# Patient Record
Sex: Female | Born: 1965 | Race: White | Hispanic: No | Marital: Married | Smoking: Never smoker
Health system: Southern US, Community
[De-identification: ages and names within clinical notes are randomized; demographics above are authoritative.]

## PROBLEM LIST (undated history)

## (undated) DIAGNOSIS — F32A Depression, unspecified: Secondary | ICD-10-CM

## (undated) DIAGNOSIS — F329 Major depressive disorder, single episode, unspecified: Secondary | ICD-10-CM

## (undated) DIAGNOSIS — K56609 Unspecified intestinal obstruction, unspecified as to partial versus complete obstruction: Secondary | ICD-10-CM

## (undated) HISTORY — PX: ABDOMINAL HYSTERECTOMY: SHX81

## (undated) HISTORY — PX: BREAST SURGERY: SHX581

---

## 2015-11-22 ENCOUNTER — Emergency Department (HOSPITAL_BASED_OUTPATIENT_CLINIC_OR_DEPARTMENT_OTHER): Payer: BLUE CROSS/BLUE SHIELD

## 2015-11-22 ENCOUNTER — Encounter (HOSPITAL_BASED_OUTPATIENT_CLINIC_OR_DEPARTMENT_OTHER): Payer: Self-pay | Admitting: *Deleted

## 2015-11-22 ENCOUNTER — Inpatient Hospital Stay (HOSPITAL_BASED_OUTPATIENT_CLINIC_OR_DEPARTMENT_OTHER)
Admission: EM | Admit: 2015-11-22 | Discharge: 2015-11-24 | DRG: 390 | Disposition: A | Discharge status: Home / Self Care | Payer: BLUE CROSS/BLUE SHIELD | Attending: Internal Medicine | Admitting: Internal Medicine

## 2015-11-22 DIAGNOSIS — Z90710 Acquired absence of both cervix and uterus: Secondary | ICD-10-CM | POA: Diagnosis not present

## 2015-11-22 DIAGNOSIS — F39 Unspecified mood [affective] disorder: Secondary | ICD-10-CM

## 2015-11-22 DIAGNOSIS — Z23 Encounter for immunization: Secondary | ICD-10-CM | POA: Diagnosis not present

## 2015-11-22 DIAGNOSIS — Z79899 Other long term (current) drug therapy: Secondary | ICD-10-CM

## 2015-11-22 DIAGNOSIS — Z880 Allergy status to penicillin: Secondary | ICD-10-CM

## 2015-11-22 DIAGNOSIS — Z885 Allergy status to narcotic agent status: Secondary | ICD-10-CM

## 2015-11-22 DIAGNOSIS — K56600 Partial intestinal obstruction, unspecified as to cause: Secondary | ICD-10-CM | POA: Diagnosis present

## 2015-11-22 DIAGNOSIS — K56609 Unspecified intestinal obstruction, unspecified as to partial versus complete obstruction: Secondary | ICD-10-CM | POA: Diagnosis present

## 2015-11-22 HISTORY — DX: Unspecified intestinal obstruction, unspecified as to partial versus complete obstruction: K56.609

## 2015-11-22 HISTORY — DX: Depression, unspecified: F32.A

## 2015-11-22 HISTORY — DX: Major depressive disorder, single episode, unspecified: F32.9

## 2015-11-22 LAB — LIPASE, BLOOD: Lipase: 33 U/L (ref 11–51)

## 2015-11-22 LAB — COMPREHENSIVE METABOLIC PANEL
ALT: 23 U/L (ref 14–54)
AST: 26 U/L (ref 15–41)
Albumin: 4.2 g/dL (ref 3.5–5.0)
Alkaline Phosphatase: 72 U/L (ref 38–126)
Anion gap: 9 (ref 5–15)
BILIRUBIN TOTAL: 0.7 mg/dL (ref 0.3–1.2)
BUN: 23 mg/dL — AB (ref 6–20)
CHLORIDE: 102 mmol/L (ref 101–111)
CO2: 28 mmol/L (ref 22–32)
CREATININE: 0.9 mg/dL (ref 0.44–1.00)
Calcium: 9.6 mg/dL (ref 8.9–10.3)
GFR calc Af Amer: >60 mL/min (ref >60)
GLUCOSE: 120 mg/dL — AB (ref 65–99)
Potassium: 3.7 mmol/L (ref 3.5–5.1)
Sodium: 139 mmol/L (ref 135–145)
TOTAL PROTEIN: 7.4 g/dL (ref 6.5–8.1)

## 2015-11-22 LAB — URINALYSIS, ROUTINE W REFLEX MICROSCOPIC
Bilirubin Urine: NEGATIVE
GLUCOSE, UA: NEGATIVE mg/dL
HGB URINE DIPSTICK: NEGATIVE
Ketones, ur: NEGATIVE mg/dL
Leukocytes, UA: NEGATIVE
Nitrite: NEGATIVE
Protein, ur: NEGATIVE mg/dL
SPECIFIC GRAVITY, URINE: 1.005 (ref 1.005–1.030)
pH: 6.5 (ref 5.0–8.0)

## 2015-11-22 LAB — CBC
HCT: 43.1 % (ref 36.0–46.0)
Hemoglobin: 14 g/dL (ref 12.0–15.0)
MCH: 27.1 pg (ref 26.0–34.0)
MCHC: 32.5 g/dL (ref 30.0–36.0)
MCV: 83.5 fL (ref 78.0–100.0)
PLATELETS: 239 10*3/uL (ref 150–400)
RBC: 5.16 MIL/uL — ABNORMAL HIGH (ref 3.87–5.11)
RDW: 16.1 % — AB (ref 11.5–15.5)
WBC: 9.5 10*3/uL (ref 4.0–10.5)

## 2015-11-22 MED ORDER — ONDANSETRON HCL 4 MG/2ML IJ SOLN: 4.0000 mg | Freq: Four times a day (QID) | INTRAMUSCULAR | Status: DC | PRN

## 2015-11-22 MED ORDER — ONDANSETRON HCL 4 MG PO TABS: 4.0000 mg | ORAL_TABLET | Freq: Four times a day (QID) | ORAL | Status: DC | PRN

## 2015-11-22 MED ORDER — MORPHINE SULFATE (PF) 2 MG/ML IV SOLN
1.0000 mg | INTRAVENOUS | Status: DC | PRN
Start: 2015-11-22 — End: 2015-11-24
  Filled 2015-11-22: qty 1

## 2015-11-22 MED ORDER — ACETAMINOPHEN 325 MG PO TABS: 650.0000 mg | ORAL_TABLET | Freq: Four times a day (QID) | ORAL | Status: DC | PRN

## 2015-11-22 MED ORDER — ENOXAPARIN SODIUM 40 MG/0.4ML ~~LOC~~ SOLN
40.0000 mg | SUBCUTANEOUS | Status: DC
  Filled 2015-11-22: qty 0.4

## 2015-11-22 MED ORDER — LORAZEPAM 2 MG/ML IJ SOLN: 0.5000 mg | Freq: Two times a day (BID) | INTRAMUSCULAR | Status: DC | PRN

## 2015-11-22 MED ORDER — INFLUENZA VAC SPLIT QUAD 0.5 ML IM SUSY
0.5000 mL | PREFILLED_SYRINGE | INTRAMUSCULAR | Status: AC
  Administered 2015-11-24: 0.5 mL via INTRAMUSCULAR
  Filled 2015-11-22 (×2): qty 0.5

## 2015-11-22 MED ORDER — KETOROLAC TROMETHAMINE 30 MG/ML IJ SOLN
30.0000 mg | Freq: Once | INTRAMUSCULAR | Status: AC
  Administered 2015-11-23: 30 mg via INTRAVENOUS
  Filled 2015-11-22: qty 1

## 2015-11-22 MED ORDER — ONDANSETRON HCL 4 MG/2ML IJ SOLN
4.0000 mg | Freq: Once | INTRAMUSCULAR | Status: AC
  Administered 2015-11-22: 4 mg via INTRAVENOUS
  Filled 2015-11-22: qty 2

## 2015-11-22 MED ORDER — IOPAMIDOL (ISOVUE-300) INJECTION 61%
100.0000 mL | Freq: Once | INTRAVENOUS | Status: AC | PRN
  Administered 2015-11-22: 100 mL via INTRAVENOUS

## 2015-11-22 MED ORDER — DEXTROSE-NACL 5-0.9 % IV SOLN
INTRAVENOUS | Status: DC
  Administered 2015-11-22: 19:00:00 via INTRAVENOUS

## 2015-11-22 MED ORDER — SODIUM CHLORIDE 0.9 % IV BOLUS (SEPSIS)
1000.0000 mL | Freq: Once | INTRAVENOUS | Status: AC
  Administered 2015-11-22: 1000 mL via INTRAVENOUS

## 2015-11-22 MED ORDER — ACETAMINOPHEN 650 MG RE SUPP: 650.0000 mg | Freq: Four times a day (QID) | RECTAL | Status: DC | PRN

## 2015-11-22 NOTE — Progress Notes (Signed)
Called by Brand MalesWill Dancy PA.   50 y/o female h/o hysterectomy 6/17 with SBO afterwards resolving spontaneously. Has had Nausea, Vomiting, abdominal pain since yesterday. Found to have SBO on CT. Dr Daphine DeutscherMartin recommending admit to medicine. She has received Zofran and 1 L NS. Have recommended to order D5NS at 100cc/hr. Accepted to Med/surg bed.   Calvert CantorSaima Bari Leib, MD

## 2015-11-22 NOTE — ED Notes (Signed)
[facility] aware of bed [facility] for [facility]

## 2015-11-22 NOTE — H&P (Signed)
[facility] History and Physical  Vertis Bauder WUJ:[id number] DOB: 1965/03/19 DOA: 11/22/2015   PCP: Shellia Cleverly, PA  Specialists: Her Gynecologist is Dr. Conard Novak in [facility]  Chief Complaint: Abdominal pain with nausea and vomiting  HPI: Emily May is a 50 y.o. female with a past medical history only for a mood disorder for which she is on bupropion as well as sertraline and who underwent hysterectomy in June of this year at [facility]. In July she had a small bowel obstruction and was hospitalized at [facility]. She was in her usual state of health until yesterday morning when she started having abdominal pain. The pain got severe, up to 10 out of 10 in intensity. It was a cramping type sensation in the mid part of her abdomen. No radiation. No precipitating factors. No aggravating or relieving factors. Associated with nausea, vomiting. Some sweating, some chills. Denies any fever. She vomited throughout the night multiple times. Vomiting out food material. No blood in the emesis. Last emesis what was at 4:30 this morning. However, she continued to have some abdominal discomfort. She decided to go to the emergency department to have it checked out due to her previous history of small bowel obstruction. Have a bowel movement this morning and has been passing gas from below.  In the emergency department, patient had a CT scan which showed evidence for partial small bowel obstruction. She was hospitalized for further management.  Home Medications: Medication list is not reconciled yet Prior to Admission medications   Medication Sig Start Date End Date Taking? Authorizing Provider  BUPROPION HCL PO Take by mouth.   Yes Historical Provider, MD    Allergies:  Allergies  Allergen Reactions  . Codeine Nausea And Vomiting  . Penicillins     hives    Past Medical History: Past Medical History:  Diagnosis Date  . Depression   . SBO (small bowel obstruction)     Past  Surgical History:  Procedure Laterality Date  . ABDOMINAL HYSTERECTOMY    . BREAST SURGERY      Social History: Lives in [place]. Does not smoke. No alcohol consumption. Independent with daily activities.  Family History:  Family History  Problem Relation Age of Onset  . Hypertension Mother   . Heart disease Father      Review of Systems - History obtained from the patient General ROS: positive for  - fatigue Psychological ROS: negative Ophthalmic ROS: negative ENT ROS: negative Allergy and Immunology ROS: negative Hematological and Lymphatic ROS: negative Endocrine ROS: negative Respiratory ROS: no cough, shortness of breath, or wheezing Cardiovascular ROS: no chest pain or dyspnea on exertion Gastrointestinal ROS: As in history of present illness Genito-Urinary ROS: no dysuria, trouble voiding, or hematuria Musculoskeletal ROS: Occasional hip pain Neurological ROS: no TIA or stroke symptoms Dermatological ROS: negative  Physical Examination  Vitals:   11/22/15 1251 11/22/15 1450 11/22/15 1610 11/22/15 1741  BP: 115/57 121/55 117/66 123/67  Pulse: 99 94 92 85  Resp: 16  18 18   Temp:    98.7 F (37.1 C)  TempSrc:      SpO2: 100% 99% 98% 100%  Weight:      Height:        BP 123/67 (BP Location: Right Arm)   Pulse 85   Temp 98.7 F (37.1 C)   Resp 18   Ht 5\' 6"  (1.676 m)   Wt 59.9 kg (132 lb)   SpO2 100%   BMI 21.31 kg/m  General appearance: alert, cooperative, appears stated age and no distress Head: Normocephalic, without obvious abnormality, atraumatic Eyes: conjunctivae/corneas clear. PERRL, EOM's intact.  Throat: lips, mucosa, and tongue normal; teeth and gums normal Neck: no adenopathy, no carotid bruit, no JVD, supple, symmetrical, trachea midline and thyroid not enlarged, symmetric, no tenderness/mass/nodules Back: symmetric, no curvature. ROM normal. No CVA tenderness. Resp: clear to auscultation bilaterally Cardio: regular rate and rhythm,  S1, S2 normal, no murmur, click, rub or gallop GI: Abdomen is soft. Minimal tenderness appreciated in the peri-Umbilical area without any rebound, rigidity or guarding. No masses or organomegaly. Bowel sounds are present and appear to be normal. Extremities: extremities normal, atraumatic, no cyanosis or edema Pulses: 2+ and symmetric Skin: Skin color, texture, turgor normal. No rashes or lesions Lymph nodes: Cervical, supraclavicular, and axillary nodes normal. Neurologic: Awake and alert. Oriented 3. Cranial nerves II-12 intact. Motor strength equal bilateral upper and lower extremities.   Labs on Admission: I have personally reviewed following labs and imaging studies  CBC:  Recent Labs Lab 11/22/15 1225  WBC 9.5  HGB 14.0  HCT 43.1  MCV 83.5  PLT 239   Basic Metabolic Panel:  Recent Labs Lab 11/22/15 1225  NA 139  K 3.7  CL 102  CO2 28  GLUCOSE 120*  BUN 23*  CREATININE 0.90  CALCIUM 9.6   GFR: Estimated Creatinine Clearance: 69.2 mL/min (by C-G formula based on SCr of 0.9 mg/dL).  Liver Function Tests:  Recent Labs Lab 11/22/15 1225  AST 26  ALT 23  ALKPHOS 72  BILITOT 0.7  PROT 7.4  ALBUMIN 4.2    Recent Labs Lab 11/22/15 1225  LIPASE 33   Urine analysis:    Component Value Date/Time   COLORURINE YELLOW 11/22/2015 1535   APPEARANCEUR CLEAR 11/22/2015 1535   LABSPEC 1.005 11/22/2015 1535   PHURINE 6.5 11/22/2015 1535   GLUCOSEU NEGATIVE 11/22/2015 1535   HGBUR NEGATIVE 11/22/2015 1535   BILIRUBINUR NEGATIVE 11/22/2015 1535   KETONESUR NEGATIVE 11/22/2015 1535   PROTEINUR NEGATIVE 11/22/2015 1535   NITRITE NEGATIVE 11/22/2015 1535   LEUKOCYTESUR NEGATIVE 11/22/2015 1535    Radiological Exams on Admission: Ct Abdomen Pelvis W Contrast  Result Date: 11/22/2015 CLINICAL DATA:  Abdominal pain and vomiting EXAM: CT ABDOMEN AND PELVIS WITH CONTRAST TECHNIQUE: Multidetector CT imaging of the abdomen and pelvis was performed using the standard  protocol following bolus administration of intravenous contrast. CONTRAST:  100mL ISOVUE-300 IOPAMIDOL (ISOVUE-300) INJECTION 61% COMPARISON:  07/19/2015 FINDINGS: Lower chest: Within normal limits. Bilateral breast implants are noted. Hepatobiliary: No focal liver abnormality is seen. No gallstones, gallbladder wall thickening, or biliary dilatation. Pancreas: Unremarkable. No pancreatic ductal dilatation or surrounding inflammatory changes. Spleen: Normal in size without focal abnormality. Adrenals/Urinary Tract: Adrenal glands are unremarkable. Kidneys are normal, without renal calculi, focal lesion, or hydronephrosis. Renal cystic changes are again seen. Bladder is unremarkable. Stomach/Bowel: There are changes consistent with small bowel dilatation in the proximal and mid jejunum. There is a focal area of caliber change identified in the midline just below the umbilicus best seen on image number 49 of series 2. This likely represents areas of postoperative adhesions. There is contrast material distal to this area consistent with a partial small bowel obstruction. The ileum and colon are within normal limits. The appendix is within normal limits. Vascular/Lymphatic: No significant vascular findings are present. No enlarged abdominal or pelvic lymph nodes. Reproductive: The uterus and ovaries have been surgically removed. Other: Minimal ascites is noted. Musculoskeletal: No  acute abnormality noted. IMPRESSION: Changes most consistent with postoperative adhesions along the anterior abdominal wall just below the umbilicus with evidence of partial small bowel obstruction. Postsurgical changes as described. Electronically Signed   By: Alcide Clever M.D.   On: 11/22/2015 15:24     Problem List  Principal Problem:   SBO (small bowel obstruction)   Assessment: This is a 50 year old Caucasian female with a past medical history of mood disorder who otherwise is in good health, presenting with nausea, vomiting,  abdominal pain. She had a hysterectomy in June and had a small bowel obstruction in July. CT scan shows partial small bowel obstruction.  Plan: #1 Partial small bowel obstruction: Most likely secondary to adhesions. Since she has not vomited since 4:30 this morning, we can hold off on NG tube. If she however, starts vomiting again, this may have to be placed. She will be kept nothing by mouth. She'll be given IV fluids. Pain medications as needed. Case has already been discussed with general surgery by the ED physician. Do not suspect there is any role for operative intervention at this time. Repeat abdominal films tomorrow morning.  #2 history of mood disorder: Hold her oral agents for now. Ativan for anxiety as needed.  DVT Prophylaxis: Lovenox Code Status: Full code Family Communication: Discussed with the patient and her daughter  Disposition Plan: MedSurg  Consults called: ED has called general surgery  Admission status: Inpatient status due to partial small bowel obstruction, need for IV fluids unable to take anything by mouth.  Further management decisions will depend on results of further testing and patient's response to treatment.   [facility]  [facility] Pager [phone]  If 7PM-7AM, please contact night-coverage [email or web address] Password [email or web address]  11/22/2015, 6:13 PM

## 2015-11-22 NOTE — ED Triage Notes (Signed)
Mid abdominal pain and vomiting since yesterday. Pale. States she has a hx of SBO. Last BM was this am.

## 2015-11-22 NOTE — ED Provider Notes (Signed)
[facility] Note   CSN: [id number] Arrival date & time: 11/22/15  1204     History   Chief Complaint Chief Complaint  Patient presents with  . Abdominal Pain  . Emesis    HPI Dora Simeone is a 50 y.o. female.  Jasie Meleski is a 50 y.o. Female who presents to the emergency department complaining of midabdominal and epigastric abdominal pain with associated nausea and vomiting since yesterday. Patient reports her symptoms began last night with multiple episodes of vomiting. She denies any hematemesis. She reports today she's had no vomiting but continued epigastric and midabdominal pain. She reports she had a normal bowel movement today. She reports she is passing very little gas. Patient reports she had a hysterectomy in June and then had a small bowel obstruction in July. She did not require surgical intervention. No other abdominal surgical history other than the hysterectomy. No treatments prior to arrival today. Patient denies sick contacts, fevers, suspicious food intake, diarrhea, hematemesis, hematochezia, urinary symptoms, chest pain, coughing, shortness of breath, or rashes.   The history is provided by the patient. No language interpreter was used.  Abdominal Pain   Associated symptoms include nausea and vomiting. Pertinent negatives include fever, diarrhea, constipation, dysuria, frequency, hematuria and headaches.  Emesis   Associated symptoms include abdominal pain. Pertinent negatives include no cough, no diarrhea, no fever and no headaches.    Past Medical History:  Diagnosis Date  . SBO (small bowel obstruction)     Patient Active Problem List   Diagnosis Date Noted  . SBO (small bowel obstruction) 11/22/2015    Past Surgical History:  Procedure Laterality Date  . ABDOMINAL HYSTERECTOMY    . BREAST SURGERY      OB History    No data available       Home Medications    Prior to Admission medications   Medication Sig Start  Date End Date Taking? Authorizing Provider  BUPROPION HCL PO Take by mouth.   Yes Historical Provider, MD    Family History No family history on file.  Social History Social History  Substance Use Topics  . Smoking status: Never Smoker  . Smokeless tobacco: Never Used  . Alcohol use No     Allergies   Penicillins   Review of Systems Review of Systems  Constitutional: Negative for fever.  HENT: Negative for sore throat.   Eyes: Negative for visual disturbance.  Respiratory: Negative for cough and shortness of breath.   Cardiovascular: Negative for chest pain.  Gastrointestinal: Positive for abdominal pain, nausea and vomiting. Negative for abdominal distention, blood in stool, constipation and diarrhea.  Genitourinary: Negative for decreased urine volume, difficulty urinating, dysuria, frequency, hematuria and urgency.  Musculoskeletal: Negative for back pain and neck pain.  Skin: Negative for rash.  Neurological: Negative for syncope, light-headedness and headaches.     Physical Exam Updated Vital Signs BP 117/66 (BP Location: Left Arm)   Pulse 92   Temp 97.6 F (36.4 C) (Oral)   Resp 18   Ht 5\' 6"  (1.676 m)   Wt 59.9 kg   SpO2 98%   BMI 21.31 kg/m   Physical Exam  Constitutional: She appears well-developed and well-nourished. No distress.  Nontoxic appearing.  HENT:  Head: Normocephalic and atraumatic.  Mouth/Throat: Oropharynx is clear and moist.  Eyes: Conjunctivae are normal. Pupils are equal, round, and reactive to light. Right eye exhibits no discharge. Left eye exhibits no discharge.  Neck: Neck supple.  Cardiovascular: Normal  rate, regular rhythm, normal heart sounds and intact distal pulses.   No murmur heard. Pulmonary/Chest: Effort normal and breath sounds normal. No respiratory distress. She has no wheezes. She has no rales.  Abdominal: Soft. She exhibits no distension and no mass. There is tenderness. There is no rebound and no guarding.    Abdomen is soft. Bowel sounds are hypoactive. Patient has epigastric and midabdominal tenderness to palpation. She also has mild left lower quadrant tenderness to palpation. No psoas or obturator sign. No CVA or flank tenderness.  Musculoskeletal: She exhibits no edema.  Lymphadenopathy:    She has no cervical adenopathy.  Neurological: She is alert. Coordination normal.  Skin: Skin is warm and dry. Capillary refill takes less than 2 seconds. No rash noted. She is not diaphoretic. No erythema. No pallor.  Psychiatric: She has a normal mood and affect. Her behavior is normal.  Nursing note and vitals reviewed.    ED Treatments / Results  Labs (all labs ordered are listed, but only abnormal results are displayed) Labs Reviewed  COMPREHENSIVE METABOLIC PANEL - Abnormal; Notable for the following:       Result Value   Glucose, Bld 120 (*)    BUN 23 (*)    All other components within normal limits  CBC - Abnormal; Notable for the following:    RBC 5.16 (*)    RDW 16.1 (*)    All other components within normal limits  URINALYSIS, ROUTINE W REFLEX MICROSCOPIC (NOT AT [facility])  LIPASE, BLOOD    EKG  EKG Interpretation None       Radiology Ct Abdomen Pelvis W Contrast  Result Date: 11/22/2015 CLINICAL DATA:  Abdominal pain and vomiting EXAM: CT ABDOMEN AND PELVIS WITH CONTRAST TECHNIQUE: Multidetector CT imaging of the abdomen and pelvis was performed using the standard protocol following bolus administration of intravenous contrast. CONTRAST:  ISOVUE-300 IOPAMIDOL (ISOVUE-300) INJECTION 61% COMPARISON:  07/19/2015 FINDINGS: Lower chest: Within normal limits. Bilateral breast implants are noted. Hepatobiliary: No focal liver abnormality is seen. No gallstones, gallbladder wall thickening, or biliary dilatation. Pancreas: Unremarkable. No pancreatic ductal dilatation or surrounding inflammatory changes. Spleen: Normal in size without focal abnormality. Adrenals/Urinary Tract:  Adrenal glands are unremarkable. Kidneys are normal, without renal calculi, focal lesion, or hydronephrosis. Renal cystic changes are again seen. Bladder is unremarkable. Stomach/Bowel: There are changes consistent with small bowel dilatation in the proximal and mid jejunum. There is a focal area of caliber change identified in the midline just below the umbilicus best seen on image number 49 of series 2. This likely represents areas of postoperative adhesions. There is contrast material distal to this area consistent with a partial small bowel obstruction. The ileum and colon are within normal limits. The appendix is within normal limits. Vascular/Lymphatic: No significant vascular findings are present. No enlarged abdominal or pelvic lymph nodes. Reproductive: The uterus and ovaries have been surgically removed. Other: Minimal ascites is noted. Musculoskeletal: No acute abnormality noted. IMPRESSION: Changes most consistent with postoperative adhesions along the anterior abdominal wall just below the umbilicus with evidence of partial small bowel obstruction. Postsurgical changes as described. Electronically Signed   By: Alcide Clever M.D.   On: 11/22/2015 15:24    Procedures Procedures (including critical care time)  Medications Ordered in ED Medications  dextrose 5 %-0.9 % sodium chloride infusion (not administered)  sodium chloride 0.9 % bolus 1,000 mL (0 mLs Intravenous Stopped 11/22/15 1423)  ondansetron (ZOFRAN) injection 4 mg (4 mg Intravenous Given 11/22/15 1249)  iopamidol (ISOVUE-300) 61 % injection 100 mL (100 mLs Intravenous Contrast Given 11/22/15 1510)     Initial Impression / Assessment and Plan / ED Course  I have reviewed the triage vital signs and the nursing notes.  Pertinent labs & imaging results that were available during my care of the patient were reviewed by me and considered in my medical decision making (see chart for details).  Clinical Course   This is a 50 y.o.  Female who presents to the emergency department complaining of midabdominal and epigastric abdominal pain with associated nausea and vomiting since yesterday. Patient reports her symptoms began last night with multiple episodes of vomiting. She denies any hematemesis. She reports today she's had no vomiting but continued epigastric and midabdominal pain. She reports she had a normal bowel movement today. She reports she is passing very little gas. Patient reports she had a hysterectomy in June and then had a small bowel obstruction in July. She did not require surgical intervention. No other abdominal surgical history other than the hysterectomy. On exam patient is afebrile nontoxic appearing. She has some midabdominal and epigastric tenderness to palpation. No peritoneal signs. CBC and CMP are unremarkable. Urinalysis is unremarkable. Lipase is within normal limits. CT abdomen and pelvis shows evidence of a partial small bowel obstruction. Recheck patient is resting comfortably in the bed. She has some mild abdominal pain but does not request any pain medicine. She is no active vomiting. I consulted with general surgeon Dr. Daphine DeutscherMartin who will see the patient in consult and recommends admission by hospitalist service. I consulted with hospitalist Dr. Butler Denmarkizwan who accepted the patient for admission and requested temp orders for med surg.  Patient agrees with plan for admission.     Final Clinical Impressions(s) / ED Diagnoses   Final diagnoses:  SBO (small bowel obstruction)    New Prescriptions New Prescriptions   No medications on file     Everlene FarrierWilliam Deondra Wigger, PA-C 11/22/15 1615    Nelva Nayobert Beaton, MD 11/25/15 [phone]

## 2015-11-23 ENCOUNTER — Inpatient Hospital Stay (HOSPITAL_COMMUNITY): Payer: BLUE CROSS/BLUE SHIELD

## 2015-11-23 LAB — CBC
HEMATOCRIT: 39.1 % (ref 36.0–46.0)
HEMOGLOBIN: 12.3 g/dL (ref 12.0–15.0)
MCH: 27 pg (ref 26.0–34.0)
MCHC: 31.5 g/dL (ref 30.0–36.0)
MCV: 85.9 fL (ref 78.0–100.0)
Platelets: 225 10*3/uL (ref 150–400)
RBC: 4.55 MIL/uL (ref 3.87–5.11)
RDW: 16.4 % — ABNORMAL HIGH (ref 11.5–15.5)
WBC: 4.5 10*3/uL (ref 4.0–10.5)

## 2015-11-23 LAB — BASIC METABOLIC PANEL
ANION GAP: 7 (ref 5–15)
BUN: 17 mg/dL (ref 6–20)
CALCIUM: 9.2 mg/dL (ref 8.9–10.3)
CO2: 26 mmol/L (ref 22–32)
Chloride: 109 mmol/L (ref 101–111)
Creatinine, Ser: 0.96 mg/dL (ref 0.44–1.00)
Glucose, Bld: 90 mg/dL (ref 65–99)
POTASSIUM: 3.2 mmol/L — AB (ref 3.5–5.1)
Sodium: 142 mmol/L (ref 135–145)

## 2015-11-23 MED ORDER — POTASSIUM CHLORIDE 10 MEQ/100ML IV SOLN
10.0000 meq | INTRAVENOUS | Status: AC
  Administered 2015-11-23: 10 meq via INTRAVENOUS
  Filled 2015-11-23: qty 100

## 2015-11-23 MED ORDER — KETOROLAC TROMETHAMINE 15 MG/ML IJ SOLN
15.0000 mg | Freq: Once | INTRAMUSCULAR | Status: AC
  Administered 2015-11-23: 15 mg via INTRAVENOUS
  Filled 2015-11-23: qty 1

## 2015-11-23 MED ORDER — POTASSIUM CHLORIDE CRYS ER 20 MEQ PO TBCR
40.0000 meq | EXTENDED_RELEASE_TABLET | Freq: Once | ORAL | Status: AC
  Administered 2015-11-23: 40 meq via ORAL
  Filled 2015-11-23: qty 2

## 2015-11-23 NOTE — Progress Notes (Signed)
[facility] PROGRESS NOTE  Emily May ZOX:[id number] DOB: 01/07/1966 DOA: 11/22/2015  PCP: Shellia CleverlyBeane, Lori M, PA  Brief History/Interval Summary: 50 year old Caucasian female with a past medical history positive only for mood disorder and who underwent hysterectomy in June of this year complicated by small bowel obstruction in July for which she was hospitalized at [facility]. She presented with abdominal pain, nausea and vomiting. Diagnosed as having partial small bowel obstruction. Patient was hospitalized for further management.  Reason for Visit: Partial small bowel obstruction  Consultants: Gen. surgery  Procedures: None  Antibiotics: None  Subjective/Interval History: Patient feels much better this morning. She continues to pass gas. Hasn't had any further bowel movements. Denies any further nausea or vomiting.  ROS: Denies shortness of breath.  Objective:  Vital Signs  Vitals:   11/22/15 1741 11/22/15 2150 11/23/15 0520 11/23/15 0955  BP: 123/67 108/64 110/65 118/62  Pulse: 85 68 62 73  Resp: 18 16 16 14   Temp: 98.7 F (37.1 C) 98.5 F (36.9 C) 98 F (36.7 C) 98.2 F (36.8 C)  TempSrc:  Oral Oral Oral  SpO2: 100% 100% 100% 100%  Weight:      Height:        Intake/Output Summary (Last 24 hours) at 11/23/15 1225 Last data filed at 11/23/15 0600  Gross per 24 hour  Intake             2100 ml  Output                0 ml  Net             2100 ml   Filed Weights   11/22/15 1208  Weight: 59.9 kg (132 lb)    General appearance: alert, cooperative, appears stated age and no distress Resp: clear to auscultation bilaterally Cardio: regular rate and rhythm, S1, S2 normal, no murmur, click, rub or gallop GI: Abdomen is soft. Mild tenderness in the peri-umbilical area. No rebound, rigidity or guarding. No masses or organomegaly. Bowel sounds are present. Extremities: extremities normal, atraumatic, no cyanosis or edema Neurologic: Awake and alert. Oriented  3. No focal neurological deficits.  Lab Results:  Data Reviewed: I have personally reviewed following labs and imaging studies  CBC:  Recent Labs Lab 11/22/15 1225 11/23/15 0534  WBC 9.5 4.5  HGB 14.0 12.3  HCT 43.1 39.1  MCV 83.5 85.9  PLT 239 225    Basic Metabolic Panel:  Recent Labs Lab 11/22/15 1225 11/23/15 0534  NA 139 142  K 3.7 3.2*  CL 102 109  CO2 28 26  GLUCOSE 120* 90  BUN 23* 17  CREATININE 0.90 0.96  CALCIUM 9.6 9.2    GFR: Estimated Creatinine Clearance: 64.9 mL/min (by C-G formula based on SCr of 0.96 mg/dL).  Liver Function Tests:  Recent Labs Lab 11/22/15 1225  AST 26  ALT 23  ALKPHOS 72  BILITOT 0.7  PROT 7.4  ALBUMIN 4.2     Recent Labs Lab 11/22/15 1225  LIPASE 33     Radiology Studies: Ct Abdomen Pelvis W Contrast  Result Date: 11/22/2015 CLINICAL DATA:  Abdominal pain and vomiting EXAM: CT ABDOMEN AND PELVIS WITH CONTRAST TECHNIQUE: Multidetector CT imaging of the abdomen and pelvis was performed using the standard protocol following bolus administration of intravenous contrast. CONTRAST:  100mL ISOVUE-300 IOPAMIDOL (ISOVUE-300) INJECTION 61% COMPARISON:  07/19/2015 FINDINGS: Lower chest: Within normal limits. Bilateral breast implants are noted. Hepatobiliary: No focal liver abnormality is seen. No gallstones, gallbladder  wall thickening, or biliary dilatation. Pancreas: Unremarkable. No pancreatic ductal dilatation or surrounding inflammatory changes. Spleen: Normal in size without focal abnormality. Adrenals/Urinary Tract: Adrenal glands are unremarkable. Kidneys are normal, without renal calculi, focal lesion, or hydronephrosis. Renal cystic changes are again seen. Bladder is unremarkable. Stomach/Bowel: There are changes consistent with small bowel dilatation in the proximal and mid jejunum. There is a focal area of caliber change identified in the midline just below the umbilicus best seen on image number 49 of series 2.  This likely represents areas of postoperative adhesions. There is contrast material distal to this area consistent with a partial small bowel obstruction. The ileum and colon are within normal limits. The appendix is within normal limits. Vascular/Lymphatic: No significant vascular findings are present. No enlarged abdominal or pelvic lymph nodes. Reproductive: The uterus and ovaries have been surgically removed. Other: Minimal ascites is noted. Musculoskeletal: No acute abnormality noted. IMPRESSION: Changes most consistent with postoperative adhesions along the anterior abdominal wall just below the umbilicus with evidence of partial small bowel obstruction. Postsurgical changes as described. Electronically Signed   By: Alcide Clever M.D.   On: 11/22/2015 15:24   Dg Abd 2 Views  Result Date: 11/23/2015 CLINICAL DATA:  Small bowel obstruction. Hysterectomy change 17. Abdominal pain. Small bowel obstruction on CT 1 day prior. EXAM: ABDOMEN - 2 VIEW COMPARISON:  CT 11/22/2015 FINDINGS: Oral contrast from CT 1 day prior is present within the transverse and descending colon. Gas is stool in the rectum. No pathologic calcifications. No dilated loops of small bowel evident IMPRESSION: No radiographic evidence small bowel obstruction. Electronically Signed   By: Genevive Bi M.D.   On: 11/23/2015 09:03     Medications:  Scheduled: . enoxaparin (LOVENOX) injection  40 mg Subcutaneous Q24H  . Influenza vac split quadrivalent PF  0.5 mL Intramuscular Tomorrow-1000  . potassium chloride  10 mEq Intravenous Q1 Hr x 4  . potassium chloride  40 mEq Oral Once   Continuous:  ZOX:[id number] **OR** acetaminophen, LORazepam, morphine injection, ondansetron **OR** ondansetron (ZOFRAN) IV  Assessment/Plan:  Principal Problem:   SBO (small bowel obstruction) Active Problems:   Mood disorder (HCC)    Partial small bowel obstruction. Patient has been improving steadily. Since she did not have any  vomiting, NG tube was not placed yesterday. Feels better this morning. Repeat abdominal films show resolution of the obstruction. She'll be placed on clear liquid diet. Seen by general surgery as well. Discussed with them. Mobilize. Advance diet as tolerated.  History of mood disorder Resume her home medications when ready to take orally.  DVT Prophylaxis: Lovenox    Code Status: Full code  Family Communication: Discussed with the patient and her daughter  Disposition Plan: Plan as outlined above. Mobilize. Advance diet. Anticipate discharge tomorrow.    LOS: 1 day   [facility]  [facility] Pager [phone] 11/23/2015, 12:25 PM  If 7PM-7AM, please contact night-coverage at [email or web address], password [facility]

## 2015-11-23 NOTE — Progress Notes (Signed)
Verbal order given from Rito EhrlichKrishnan that patient may have one time order for toradol 15 mg iv push, order entered Stanford BreedBracey, Tremaine Earwood N RN 11:05 AM 11-23-2015

## 2015-11-23 NOTE — Consult Note (Signed)
[facility] Surgery Consult Note  Emily May Jun 13, 1965  [id number].    Requesting MD: Wynelle Cleveland, MD Chief Complaint/Reason for Consult: SBO HPI:  50 y/o female with PMH of mood disorder and SBO admitted for findings consistent with SBO. Patient started having crampy, non-radiating abdominal pain the morning of 10/19, which gradually worsened. She developed vomiting. She denies recent illness, fever, chills, CP, SOB, hematochezia, melena, or hematemesis. She was last admitted in July for an SBO that resolved without operative intervention. Past abdominal surgeries significant for hysterectomy in June 2017. She denies use of blood thinning medications.   ED course: vomiting subsided so held off on placement of NGT, CT scan significant for partial small bowel obstruction with oral contrast in the colon.  Today patient states she is having flatus, had a formed BM this morning, her pain is controlled, and she is ambulating. She denies nausea or vomiting.  ROS: All systems reviewed and otherwise negative except for as above  Family History  Problem Relation Age of Onset  . Hypertension Mother   . Heart disease Father     Past Medical History:  Diagnosis Date  . Depression   . SBO (small bowel obstruction)     Past Surgical History:  Procedure Laterality Date  . ABDOMINAL HYSTERECTOMY    . BREAST SURGERY      Social History:  reports that she has never smoked. She has never used smokeless tobacco. She reports that she does not drink alcohol or use drugs.  Allergies:  Allergies  Allergen Reactions  . Oxycodone Nausea And Vomiting    NOT SURE IF HAD ACETAMINOPHEN IN IT  . Codeine Nausea And Vomiting  . Penicillins Hives    Has patient had a PCN reaction causing immediate rash, facial/tongue/throat swelling, SOB or lightheadedness with hypotension: No Has patient had a PCN reaction causing severe rash involving mucus membranes or skin necrosis: Yes Has patient had a PCN  reaction that required hospitalization No Has patient had a PCN reaction occurring within the last 10 years: Yes If all of the above answers are "NO", then may proceed with Cephalosporin use.     Medications Prior to Admission  Medication Sig Dispense Refill  . ALPRAZolam (XANAX) 0.25 MG tablet Take 0.25 mg by mouth 3 (three) times daily as needed for sleep.    Marland Kitchen buPROPion (WELLBUTRIN XL) 300 MG 24 hr tablet Take 300 mg by mouth daily.    Marland Kitchen ECHINACEA PO Take 1 tablet by mouth daily.    Marland Kitchen ibuprofen (ADVIL,MOTRIN) 800 MG tablet Take 800 mg by mouth 3 (three) times daily as needed for pain.    Marland Kitchen ketoconazole (NIZORAL) 2 % shampoo Apply 1 application topically every 28 (twenty-eight) days.    . Multiple Vitamin (MULTIVITAMIN WITH MINERALS) TABS tablet Take 1 tablet by mouth daily.    . Multiple Vitamins-Minerals (HAIR SKIN AND NAILS FORMULA PO) Take 1 tablet by mouth daily.    . sertraline (ZOLOFT) 50 MG tablet Take 50 mg by mouth at bedtime.  2    Blood pressure 118/62, pulse 73, temperature 98.2 F (36.8 C), temperature source Oral, resp. rate 14, height _0  (1.676 m), weight 132 lb (59.9 kg), SpO2 100 %. Physical Exam: General: pleasant, WD/WN white female who is laying in bed in NAD HEENT: head is normocephalic, atraumatic.   Heart: regular, rate, and rhythm.  No obvious murmurs, gallops, or rubs noted.  Palpable pedal pulses bilaterally Lungs: CTAB, no wheezes, rhonchi, or rales noted.  Respiratory  effort nonlabored Abd: soft, NT/ND, +BS, no masses, hernias, or organomegaly; well-healing laparotomy scar inferior to the umbilicus  MS: all 4 extremities are symmetrical with no cyanosis, clubbing, or edema. Skin: warm and dry with no masses, lesions, or rashes Psych: appropriate affect. Neuro: A&Ox3, moves all extremities spontaneously, normal speech  Results for orders placed or performed during the hospital encounter of 11/22/15 (from the past 48 hour(s))  Lipase, blood     Status:  None   Collection Time: 11/22/15 12:25 PM  Result Value Ref Range   Lipase 33 11 - 51 U/L  Comprehensive metabolic panel     Status: Abnormal   Collection Time: 11/22/15 12:25 PM  Result Value Ref Range   Sodium 139 135 - 145 mmol/L   Potassium 3.7 3.5 - 5.1 mmol/L   Chloride 102 101 - 111 mmol/L   CO2 28 22 - 32 mmol/L   Glucose, Bld 120 (H) 65 - 99 mg/dL   BUN 23 (H) 6 - 20 mg/dL   Creatinine, Ser 0.90 0.44 - 1.00 mg/dL   Calcium 9.6 8.9 - 10.3 mg/dL   Total Protein 7.4 6.5 - 8.1 g/dL   Albumin 4.2 3.5 - 5.0 g/dL   AST 26 15 - 41 U/L   ALT 23 14 - 54 U/L   Alkaline Phosphatase 72 38 - 126 U/L   Total Bilirubin 0.7 0.3 - 1.2 mg/dL   GFR calc non Af Amer >60 >60 mL/min   GFR calc Af Amer >60 >60 mL/min    Comment: (NOTE) The eGFR has been calculated using the CKD EPI equation. This calculation has not been validated in all clinical situations. eGFR's persistently <60 mL/min signify possible Chronic Kidney Disease.    Anion gap 9 5 - 15  CBC     Status: Abnormal   Collection Time: 11/22/15 12:25 PM  Result Value Ref Range   WBC 9.5 4.0 - 10.5 K/uL   RBC 5.16 (H) 3.87 - 5.11 MIL/uL   Hemoglobin 14.0 12.0 - 15.0 g/dL   HCT 43.1 36.0 - 46.0 %   MCV 83.5 78.0 - 100.0 fL   MCH 27.1 26.0 - 34.0 pg   MCHC 32.5 30.0 - 36.0 g/dL   RDW 16.1 (H) 11.5 - 15.5 %   Platelets 239 150 - 400 K/uL  Urinalysis, Routine w reflex microscopic (not at [facility])     Status: None   Collection Time: 11/22/15  3:35 PM  Result Value Ref Range   Color, Urine YELLOW YELLOW   APPearance CLEAR CLEAR   Specific Gravity, Urine 1.005 1.005 - 1.030   pH 6.5 5.0 - 8.0   Glucose, UA NEGATIVE NEGATIVE mg/dL   Hgb urine dipstick NEGATIVE NEGATIVE   Bilirubin Urine NEGATIVE NEGATIVE   Ketones, ur NEGATIVE NEGATIVE mg/dL   Protein, ur NEGATIVE NEGATIVE mg/dL   Nitrite NEGATIVE NEGATIVE   Leukocytes, UA NEGATIVE NEGATIVE    Comment: MICROSCOPIC NOT DONE ON URINES WITH NEGATIVE PROTEIN, BLOOD, LEUKOCYTES,  NITRITE, OR GLUCOSE <1000 mg/dL.  Basic metabolic panel     Status: Abnormal   Collection Time: 11/23/15  5:34 AM  Result Value Ref Range   Sodium 142 135 - 145 mmol/L   Potassium 3.2 (L) 3.5 - 5.1 mmol/L   Chloride 109 101 - 111 mmol/L   CO2 26 22 - 32 mmol/L   Glucose, Bld 90 65 - 99 mg/dL   BUN 17 6 - 20 mg/dL   Creatinine, Ser 0.96 0.44 - 1.00 mg/dL  Calcium 9.2 8.9 - 10.3 mg/dL   GFR calc non Af Amer >60 >60 mL/min   GFR calc Af Amer >60 >60 mL/min    Comment: (NOTE) The eGFR has been calculated using the CKD EPI equation. This calculation has not been validated in all clinical situations. eGFR's persistently <60 mL/min signify possible Chronic Kidney Disease.    Anion gap 7 5 - 15  CBC     Status: Abnormal   Collection Time: 11/23/15  5:34 AM  Result Value Ref Range   WBC 4.5 4.0 - 10.5 K/uL   RBC 4.55 3.87 - 5.11 MIL/uL   Hemoglobin 12.3 12.0 - 15.0 g/dL   HCT 39.1 36.0 - 46.0 %   MCV 85.9 78.0 - 100.0 fL   MCH 27.0 26.0 - 34.0 pg   MCHC 31.5 30.0 - 36.0 g/dL   RDW 16.4 (H) 11.5 - 15.5 %   Platelets 225 150 - 400 K/uL   Ct Abdomen Pelvis W Contrast  Result Date: 11/22/2015 CLINICAL DATA:  Abdominal pain and vomiting EXAM: CT ABDOMEN AND PELVIS WITH CONTRAST TECHNIQUE: Multidetector CT imaging of the abdomen and pelvis was performed using the standard protocol following bolus administration of intravenous contrast. CONTRAST:  150m ISOVUE-300 IOPAMIDOL (ISOVUE-300) INJECTION 61% COMPARISON:  07/19/2015 FINDINGS: Lower chest: Within normal limits. Bilateral breast implants are noted. Hepatobiliary: No focal liver abnormality is seen. No gallstones, gallbladder wall thickening, or biliary dilatation. Pancreas: Unremarkable. No pancreatic ductal dilatation or surrounding inflammatory changes. Spleen: Normal in size without focal abnormality. Adrenals/Urinary Tract: Adrenal glands are unremarkable. Kidneys are normal, without renal calculi, focal lesion, or hydronephrosis.  Renal cystic changes are again seen. Bladder is unremarkable. Stomach/Bowel: There are changes consistent with small bowel dilatation in the proximal and mid jejunum. There is a focal area of caliber change identified in the midline just below the umbilicus best seen on image number 49 of series 2. This likely represents areas of postoperative adhesions. There is contrast material distal to this area consistent with a partial small bowel obstruction. The ileum and colon are within normal limits. The appendix is within normal limits. Vascular/Lymphatic: No significant vascular findings are present. No enlarged abdominal or pelvic lymph nodes. Reproductive: The uterus and ovaries have been surgically removed. Other: Minimal ascites is noted. Musculoskeletal: No acute abnormality noted. IMPRESSION: Changes most consistent with postoperative adhesions along the anterior abdominal wall just below the umbilicus with evidence of partial small bowel obstruction. Postsurgical changes as described. Electronically Signed   By: MInez CatalinaM.D.   On: 11/22/2015 15:24   Dg Abd 2 Views  Result Date: 11/23/2015 CLINICAL DATA:  Small bowel obstruction. Hysterectomy change 17. Abdominal pain. Small bowel obstruction on CT 1 day prior. EXAM: ABDOMEN - 2 VIEW COMPARISON:  CT 11/22/2015 FINDINGS: Oral contrast from CT 1 day prior is present within the transverse and descending colon. Gas is stool in the rectum. No pathologic calcifications. No dilated loops of small bowel evident IMPRESSION: No radiographic evidence small bowel obstruction. Electronically Signed   By: SSuzy BouchardM.D.   On: 11/23/2015 09:03   Assessment/Plan SBO, recurrent - admitted in July for SBO, non-op management - pain improving, minimal tenderness to deep palpation on exam - passing flatus, having BM - ambulating  Plan: start clears and advance to soft, low residue diet as tolerated Continue daily miralax PRN Ambulate  General surgery  will sign off but will be available as needed.   EJill Alexanders [facility] 11/23/2015,  10:03 AM Pager: [phone] Consults: [phone] Mon-Fri 7:00 am-4:30 pm Sat-Sun 7:00 am-11:30 am

## 2015-11-24 LAB — BASIC METABOLIC PANEL
Anion gap: 6 (ref 5–15)
BUN: 17 mg/dL (ref 6–20)
CALCIUM: 9.2 mg/dL (ref 8.9–10.3)
CHLORIDE: 107 mmol/L (ref 101–111)
CO2: 27 mmol/L (ref 22–32)
CREATININE: 0.74 mg/dL (ref 0.44–1.00)
GFR calc non Af Amer: >60 mL/min (ref >60)
Glucose, Bld: 91 mg/dL (ref 65–99)
Potassium: 3.7 mmol/L (ref 3.5–5.1)
SODIUM: 140 mmol/L (ref 135–145)

## 2015-11-24 LAB — CBC
HCT: 36.1 % (ref 36.0–46.0)
HEMOGLOBIN: 11.6 g/dL — AB (ref 12.0–15.0)
MCH: 27.4 pg (ref 26.0–34.0)
MCHC: 32.1 g/dL (ref 30.0–36.0)
MCV: 85.3 fL (ref 78.0–100.0)
PLATELETS: 190 10*3/uL (ref 150–400)
RBC: 4.23 MIL/uL (ref 3.87–5.11)
RDW: 15.9 % — AB (ref 11.5–15.5)
WBC: 4.5 10*3/uL (ref 4.0–10.5)

## 2015-11-24 NOTE — Discharge Instructions (Signed)
Small Bowel Obstruction °A small bowel obstruction is a blockage in the small bowel. The small bowel, which is also called the small intestine, is a long, slender tube that connects the stomach to the colon. When a person eats and drinks, food and fluids go from the stomach to the small bowel. This is where most of the nutrients in the food and fluids are absorbed. °A small bowel obstruction will prevent food and fluids from passing through the small bowel as they normally do during digestion. The small bowel can become partially or completely blocked. This can cause symptoms such as abdominal pain, vomiting, and bloating. If this condition is not treated, it can be dangerous because the small bowel could rupture. °CAUSES °Common causes of this condition include: °· Scar tissue from previous surgery or radiation treatment. °· Recent surgery. This may cause the movements of the bowel to slow down and cause food to block the intestine. °· Hernias. °· Inflammatory bowel disease (colitis). °· Twisting of the bowel (volvulus). °· Tumors. °· A foreign body. °· Slipping of a part of the bowel into another part (intussusception). °SYMPTOMS °Symptoms of this condition include: °· Abdominal pain. This may be dull cramps or sharp pain. It may occur in one area, or it may be present in the entire abdomen. Pain can range from mild to severe, depending on the degree of obstruction. °· Nausea and vomiting. Vomit may be greenish or a yellow bile color. °· Abdominal bloating. °· Constipation. °· Lack of passing gas. °· Frequent belching. °· Diarrhea. This may occur if the obstruction is partial and runny stool is able to leak around the obstruction. °DIAGNOSIS °This condition may be diagnosed based on a physical exam, medical history, and X-rays of the abdomen. You may also have other tests, such as a CT scan of the abdomen and pelvis. °TREATMENT °Treatment for this condition depends on the cause and severity of the problem.  Treatment options may include: °· Bed rest along with fluids and pain medicines that are given through an IV tube inserted into one of your veins. Sometimes, this is all that is needed for the obstruction to improve. °· Following a simple diet. In some cases, a clear liquid diet may be required for several days. This allows the bowel to rest. °· Placement of a small tube (nasogastric tube) into the stomach. When the bowel is blocked, it usually swells up like a balloon that is filled with air and fluids. The air and fluids may be removed by suction through the nasogastric tube. This can help with pain, discomfort, and nausea. It can also help the obstruction to clear up faster. °· Surgery. This may be required if other treatments do not work. Bowel obstruction from a hernia may require early surgery and can be an emergency procedure. Surgery may also be required for scar tissue that causes frequent or severe obstructions. °HOME CARE INSTRUCTIONS °· Get plenty of rest. °· Follow instructions from your health care provider about eating restrictions. You may need to avoid solid foods and consume only clear liquids until your condition improves. °· Take over-the-counter and prescription medicines only as told by your health care provider. °· Keep all follow-up visits as told by your health care provider. This is important. °SEEK MEDICAL CARE IF: °· You have a fever. °· You have chills. °SEEK IMMEDIATE MEDICAL CARE IF: °· You have increased pain or cramping. °· You vomit blood. °· You have uncontrolled vomiting or nausea. °· You cannot drink   fluids because of vomiting or pain. °· You develop confusion. °· You begin feeling very dry or thirsty (dehydrated). °· You have severe bloating. °· You feel extremely weak or you faint. °  °This information is not intended to replace advice given to you by your health care provider. Make sure you discuss any questions you have with your health care provider. °  °Document Released:  04/08/2005 Document Revised: 10/11/2014 Document Reviewed: 03/16/2014 °[employer]vier Interactive Patient Education ©2016 [employer] ° ° °

## 2015-11-24 NOTE — Discharge Summary (Signed)
[facility]  Physician Discharge Summary   Patient ID: Emily GoryGina Depaola MRN: [id number] DOB/AGE: 51/05/1965 51 y.o.  Admit date: 11/22/2015 Discharge date: 11/24/2015  PCP: Shellia CleverlyBeane, Lori M, PA  DISCHARGE DIAGNOSES:  Principal Problem:   SBO (small bowel obstruction) Active Problems:   Mood disorder (HCC)   RECOMMENDATIONS FOR OUTPATIENT FOLLOW UP: 1. Patient instructed to follow-up with her primary care provider or her gynecologist within a week   DISCHARGE CONDITION: fair  Diet recommendation: Soft diet for a few days and then as before  [facility]   11/22/15 1208  Weight: 59.9 kg (132 lb)    INITIAL HISTORY: 50 year old Caucasian female with a past medical history positive only for mood disorder and who underwent hysterectomy in June of this year complicated by small bowel obstruction in July for which she was hospitalized at [facility]. She presented with abdominal pain, nausea and vomiting. Diagnosed as having partial small bowel obstruction. Patient was hospitalized for further management.  Consultations:  General surgery  Procedures:  None  HOSPITAL COURSE:   Partial small bowel obstruction. Patient was admitted to the hospital. Her symptoms however started improving quickly. She did not require NG tube placement. She was seen by general surgery. There was no need for surgical intervention. She was started on clear liquid diet. This was advanced to soft diet. She has been tolerating without any difficulty. Abdominal pain has also improved. She is passing gas and has had bowel movements. Okay for discharge home today. She knows to follow-up with her gynecologist or PCP within a week.  History of mood disorder May resume her home medications.  Overall improved. Ambulating in the hallway without any difficulty. Okay for discharge today.    PERTINENT LABS:  The results of significant diagnostics from this hospitalization (including imaging,  microbiology, ancillary and laboratory) are listed below for reference.      Labs: Basic Metabolic Panel:  Recent Labs Lab 11/22/15 1225 11/23/15 0534 11/24/15 0507  NA 139 142 140  K 3.7 3.2* 3.7  CL 102 109 107  CO2 28 26 27   GLUCOSE 120* 90 91  BUN 23* 17 17  CREATININE 0.90 0.96 0.74  CALCIUM 9.6 9.2 9.2   Liver Function Tests:  Recent Labs Lab 11/22/15 1225  AST 26  ALT 23  ALKPHOS 72  BILITOT 0.7  PROT 7.4  ALBUMIN 4.2    Recent Labs Lab 11/22/15 1225  LIPASE 33   CBC:  Recent Labs Lab 11/22/15 1225 11/23/15 0534 11/24/15 0507  WBC 9.5 4.5 4.5  HGB 14.0 12.3 11.6*  HCT 43.1 39.1 36.1  MCV 83.5 85.9 85.3  PLT 239 225 190    IMAGING STUDIES Ct Abdomen Pelvis W Contrast  Result Date: 11/22/2015 CLINICAL DATA:  Abdominal pain and vomiting EXAM: CT ABDOMEN AND PELVIS WITH CONTRAST TECHNIQUE: Multidetector CT imaging of the abdomen and pelvis was performed using the standard protocol following bolus administration of intravenous contrast. CONTRAST:  100mL ISOVUE-300 IOPAMIDOL (ISOVUE-300) INJECTION 61% COMPARISON:  07/19/2015 FINDINGS: Lower chest: Within normal limits. Bilateral breast implants are noted. Hepatobiliary: No focal liver abnormality is seen. No gallstones, gallbladder wall thickening, or biliary dilatation. Pancreas: Unremarkable. No pancreatic ductal dilatation or surrounding inflammatory changes. Spleen: Normal in size without focal abnormality. Adrenals/Urinary Tract: Adrenal glands are unremarkable. Kidneys are normal, without renal calculi, focal lesion, or hydronephrosis. Renal cystic changes are again seen. Bladder is unremarkable. Stomach/Bowel: There are changes consistent with small bowel dilatation in the proximal and mid jejunum. There is  a focal area of caliber change identified in the midline just below the umbilicus best seen on image number 49 of series 2. This likely represents areas of postoperative adhesions. There is contrast  material distal to this area consistent with a partial small bowel obstruction. The ileum and colon are within normal limits. The appendix is within normal limits. Vascular/Lymphatic: No significant vascular findings are present. No enlarged abdominal or pelvic lymph nodes. Reproductive: The uterus and ovaries have been surgically removed. Other: Minimal ascites is noted. Musculoskeletal: No acute abnormality noted. IMPRESSION: Changes most consistent with postoperative adhesions along the anterior abdominal wall just below the umbilicus with evidence of partial small bowel obstruction. Postsurgical changes as described. Electronically Signed   By: Alcide Clever M.D.   On: 11/22/2015 15:24   Dg Abd 2 Views  Result Date: 11/23/2015 CLINICAL DATA:  Small bowel obstruction. Hysterectomy change 17. Abdominal pain. Small bowel obstruction on CT 1 day prior. EXAM: ABDOMEN - 2 VIEW COMPARISON:  CT 11/22/2015 FINDINGS: Oral contrast from CT 1 day prior is present within the transverse and descending colon. Gas is stool in the rectum. No pathologic calcifications. No dilated loops of small bowel evident IMPRESSION: No radiographic evidence small bowel obstruction. Electronically Signed   By: Genevive Bi M.D.   On: 11/23/2015 09:03    DISCHARGE EXAMINATION: Vitals:   11/23/15 0955 11/23/15 1340 11/23/15 2137 11/24/15 0503  BP: 118/62 123/71 117/64 114/64  Pulse: 73 71 72 70  Resp: 14 16 18 16   Temp: 98.2 F (36.8 C) 97.8 F (36.6 C) 98.6 F (37 C) 98.1 F (36.7 C)  TempSrc: Oral Oral Oral Oral  SpO2: 100% 100% 97% 100%  Weight:      Height:       General appearance: alert, cooperative, appears stated age and no distress Resp: clear to auscultation bilaterally Cardio: regular rate and rhythm, S1, S2 normal, no murmur, click, rub or gallop GI: soft, mildly tender without any rebound, rigidity or guarding.; bowel sounds normal; no masses,  no organomegaly Extremities: extremities normal,  atraumatic, no cyanosis or edema  DISPOSITION: Home  Discharge Instructions    Call MD for:  difficulty breathing, headache or visual disturbances    Complete by:  As directed    Call MD for:  extreme fatigue    Complete by:  As directed    Call MD for:  persistant dizziness or light-headedness    Complete by:  As directed    Call MD for:  persistant nausea and vomiting    Complete by:  As directed    Call MD for:  severe uncontrolled pain    Complete by:  As directed    Call MD for:  temperature >100.4    Complete by:  As directed    Discharge instructions    Complete by:  As directed    Please follow up with your PCP or Gyn within 1 week. Seek attention if symptoms recur. Soft diet for 3 more days and then advance to usual diet.  You were cared for by a hospitalist during your hospital stay. If you have any questions about your discharge medications or the care you received while you were in the hospital after you are discharged, you can call the unit and asked to speak with the hospitalist on call if the hospitalist that took care of you is not available. Once you are discharged, your primary care physician will handle any further medical issues. Please note that NO REFILLS  for any discharge medications will be authorized once you are discharged, as it is imperative that you return to your primary care physician (or establish a relationship with a primary care physician if you do not have one) for your aftercare needs so that they can reassess your need for medications and monitor your lab values. If you do not have a primary care physician, you can call [phone] for a physician referral.   Increase activity slowly    Complete by:  As directed       ALLERGIES:  Allergies  Allergen Reactions  . Oxycodone Nausea And Vomiting    NOT SURE IF HAD ACETAMINOPHEN IN IT  . Codeine Nausea And Vomiting  . Penicillins Hives    Has patient had a PCN reaction causing immediate rash,  facial/tongue/throat swelling, SOB or lightheadedness with hypotension: No Has patient had a PCN reaction causing severe rash involving mucus membranes or skin necrosis: Yes Has patient had a PCN reaction that required hospitalization No Has patient had a PCN reaction occurring within the last 10 years: Yes If all of the above answers are "NO", then may proceed with Cephalosporin use.      Discharge Medication List as of 11/24/2015  9:16 AM    CONTINUE these medications which have NOT CHANGED   Details  ALPRAZolam (XANAX) 0.25 MG tablet Take 0.25 mg by mouth 3 (three) times daily as needed for sleep., Historical Med    buPROPion (WELLBUTRIN XL) 300 MG 24 hr tablet Take 300 mg by mouth daily., Starting Wed 10/10/2015, Historical Med    ECHINACEA PO Take 1 tablet by mouth daily., Historical Med    ibuprofen (ADVIL,MOTRIN) 800 MG tablet Take 800 mg by mouth 3 (three) times daily as needed for pain., Starting Sun 07/29/2015, Historical Med    ketoconazole (NIZORAL) 2 % shampoo Apply 1 application topically every 28 (twenty-eight) days., Starting Mon 05/21/2015, Historical Med    !! Multiple Vitamin (MULTIVITAMIN WITH MINERALS) TABS tablet Take 1 tablet by mouth daily., Historical Med    !! Multiple Vitamins-Minerals (HAIR SKIN AND NAILS FORMULA PO) Take 1 tablet by mouth daily., Historical Med    sertraline (ZOLOFT) 50 MG tablet Take 50 mg by mouth at bedtime., Starting Wed 10/10/2015, Historical Med     !! - Potential duplicate medications found. Please discuss with provider.       Follow-up Information    EISENBERG,BARBARA, MD. Schedule an appointment as soon as possible for a visit in 4 day(s).   Specialty:  Obstetrics and Gynecology Contact information: [address] [place] [address] [phone]           TOTAL DISCHARGE TIME: 35 minutes  [facility]  [facility] Pager [phone]  11/24/2015, 12:53 PM

## 2015-11-24 NOTE — Progress Notes (Signed)
Assessment unchanged. Pt verbalized understanding of dc instructions through teach back including follow up care and when to call the doctor. No scripts at dc. Discharged via foot per pt request to front entrance. Accompanied by student nurse and husband.

## 2017-02-10 IMAGING — CT CT ABD-PELV W/ CM
2 of 5 series · 15 of 46 positions shown, 17 images · IV contrast (APPLIED)
Comparison: 07/19/2015

CLINICAL DATA: Abdominal pain and vomiting

EXAM:
CT ABDOMEN AND PELVIS WITH CONTRAST
TECHNIQUE: Multidetector CT imaging of the abdomen and pelvis was performed
using the standard protocol following bolus administration of
intravenous contrast.
CONTRAST:  100mL FML2JB-BRR IOPAMIDOL (FML2JB-BRR) INJECTION 61%

[Series 2: axial st · axial · 0.90mm/px · z∈[-486,-81]mm · 12 of 93 slices shown, 14 images]
[im 6/93  soft-tissue]
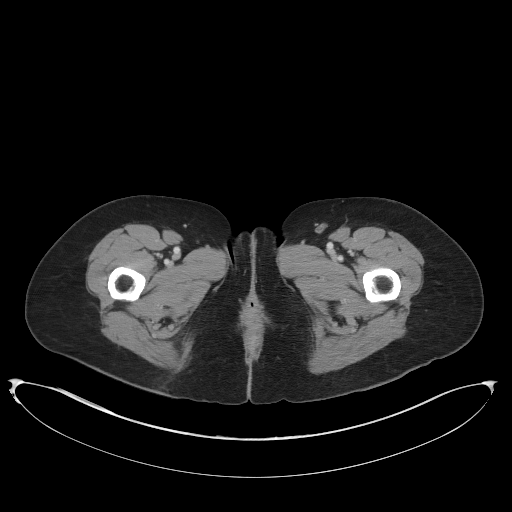
[im 6/93  bone]
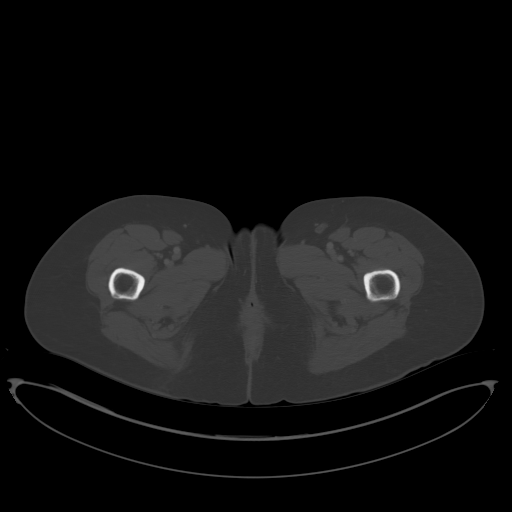
[im 17/93  soft-tissue]
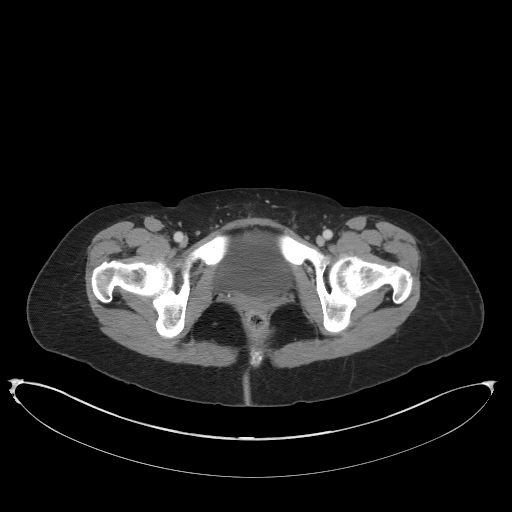
[im 22/93  soft-tissue]
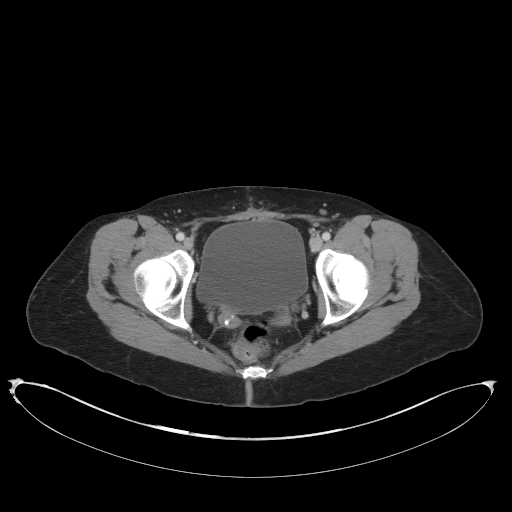
[im 28/93  soft-tissue]
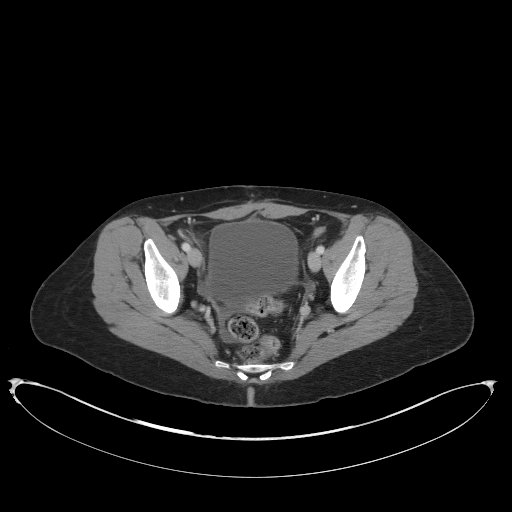
[im 38/93  soft-tissue]
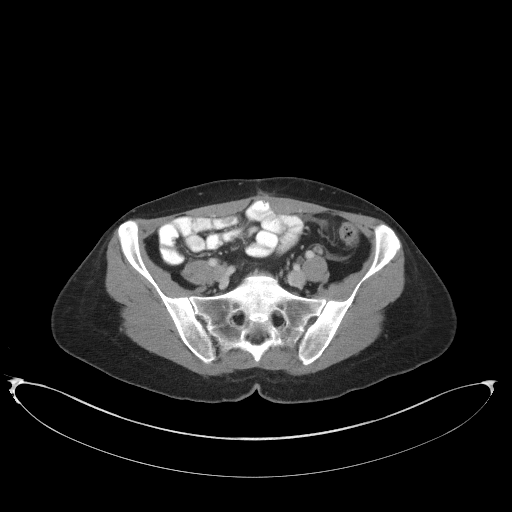
[im 44/93  soft-tissue]
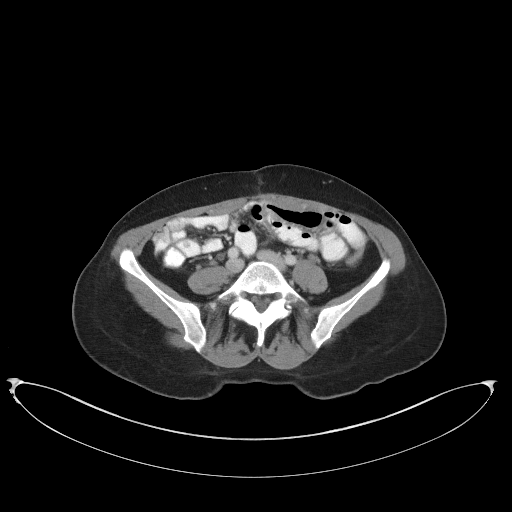
[im 49/93  soft-tissue]
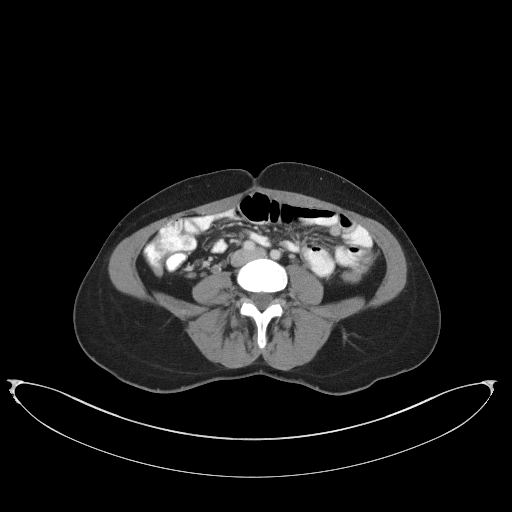
[im 60/93  soft-tissue]
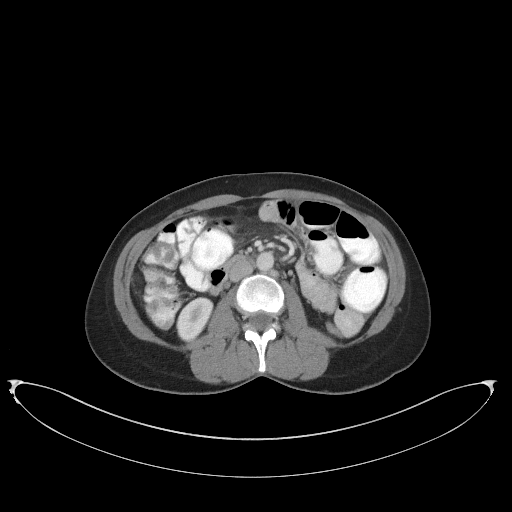
[im 65/93  soft-tissue]
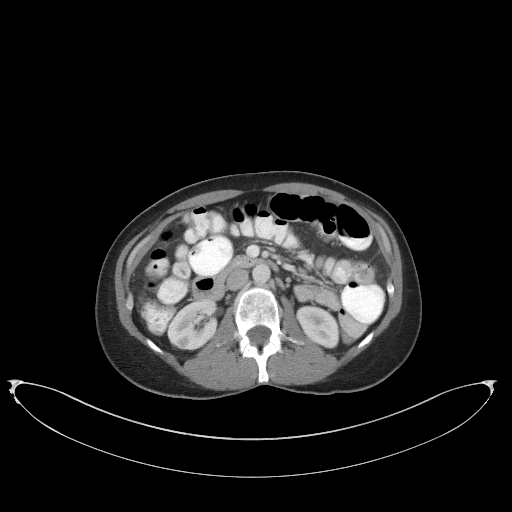
[im 65/93  bone]
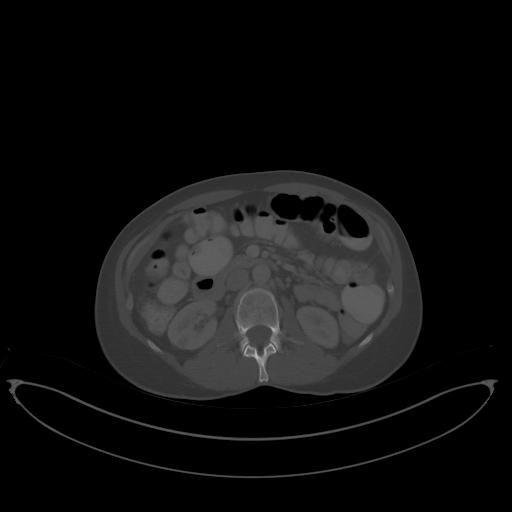
[im 71/93  soft-tissue]
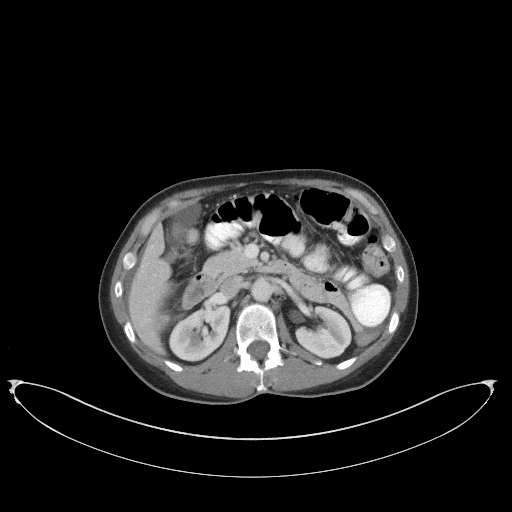
[im 82/93  soft-tissue]
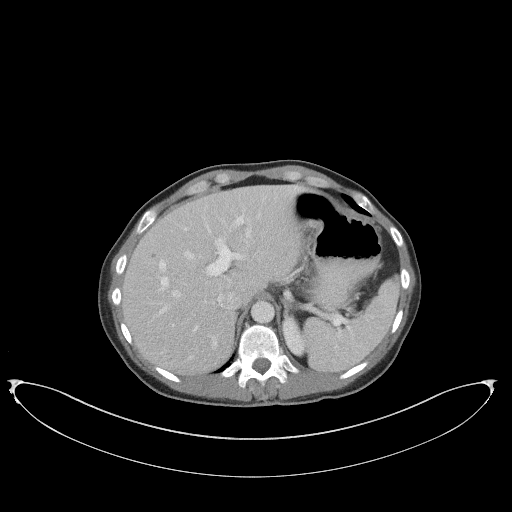
[im 87/93  soft-tissue]
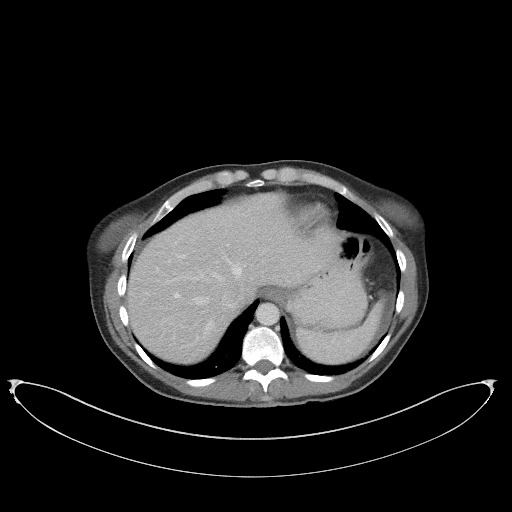

[Series 5: coronal st · coronal · 0.75mm/px · 3 of 70 slices shown]
[im 24/70  soft-tissue]
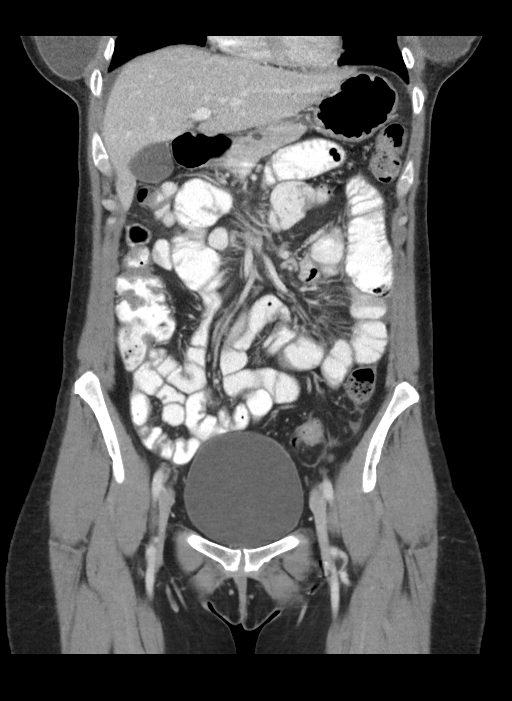
[im 31/70  soft-tissue]
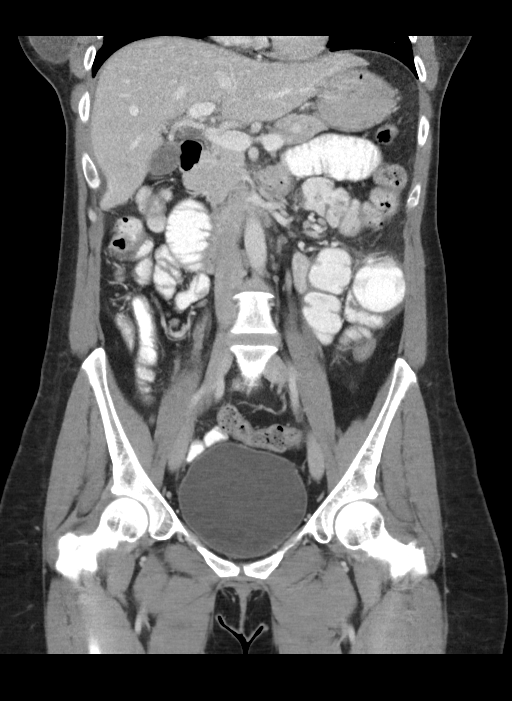
[im 39/70  soft-tissue]
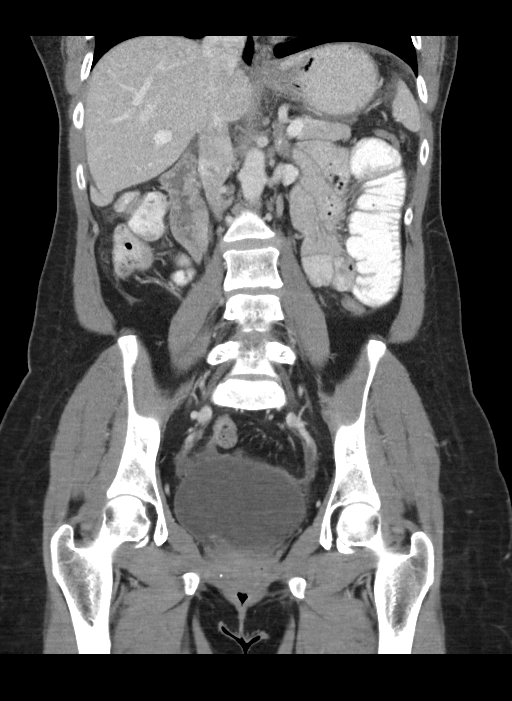

[15 of 46 positions shown; findings below may reference images not displayed]

FINDINGS: Lower chest: Within normal limits. Bilateral breast implants are
noted.

Hepatobiliary: No focal liver abnormality is seen. No gallstones,
gallbladder wall thickening, or biliary dilatation.

Pancreas: Unremarkable. No pancreatic ductal dilatation or
surrounding inflammatory changes.

Spleen: Normal in size without focal abnormality.

Adrenals/Urinary Tract: Adrenal glands are unremarkable. Kidneys are
normal, without renal calculi, focal lesion, or hydronephrosis.
Renal cystic changes are again seen. Bladder is unremarkable.

Stomach/Bowel: There are changes consistent with small bowel
dilatation in the proximal and mid jejunum. There is a focal area of
caliber change identified in the midline just below the umbilicus
best seen on image number 49 of series 2. This likely represents
areas of postoperative adhesions. There is contrast material distal
to this area consistent with a partial small bowel obstruction. The
ileum and colon are within normal limits. The appendix is within
normal limits.

Vascular/Lymphatic: No significant vascular findings are present. No
enlarged abdominal or pelvic lymph nodes.

Reproductive: The uterus and ovaries have been surgically removed.

Other: Minimal ascites is noted.

Musculoskeletal: No acute abnormality noted.
IMPRESSION: Changes most consistent with postoperative adhesions along the
anterior abdominal wall just below the umbilicus with evidence of
partial small bowel obstruction.

Postsurgical changes as described.

## 2017-02-11 IMAGING — DX DG ABDOMEN 2V
2 series · 2 of 2 positions shown · non-contrast
Comparison: CT 11/22/2015

CLINICAL DATA: Small bowel obstruction. Hysterectomy change 17.
Abdominal pain. Small bowel obstruction on CT 1 day prior.

EXAM:
ABDOMEN - 2 VIEW

[abdomen erect]
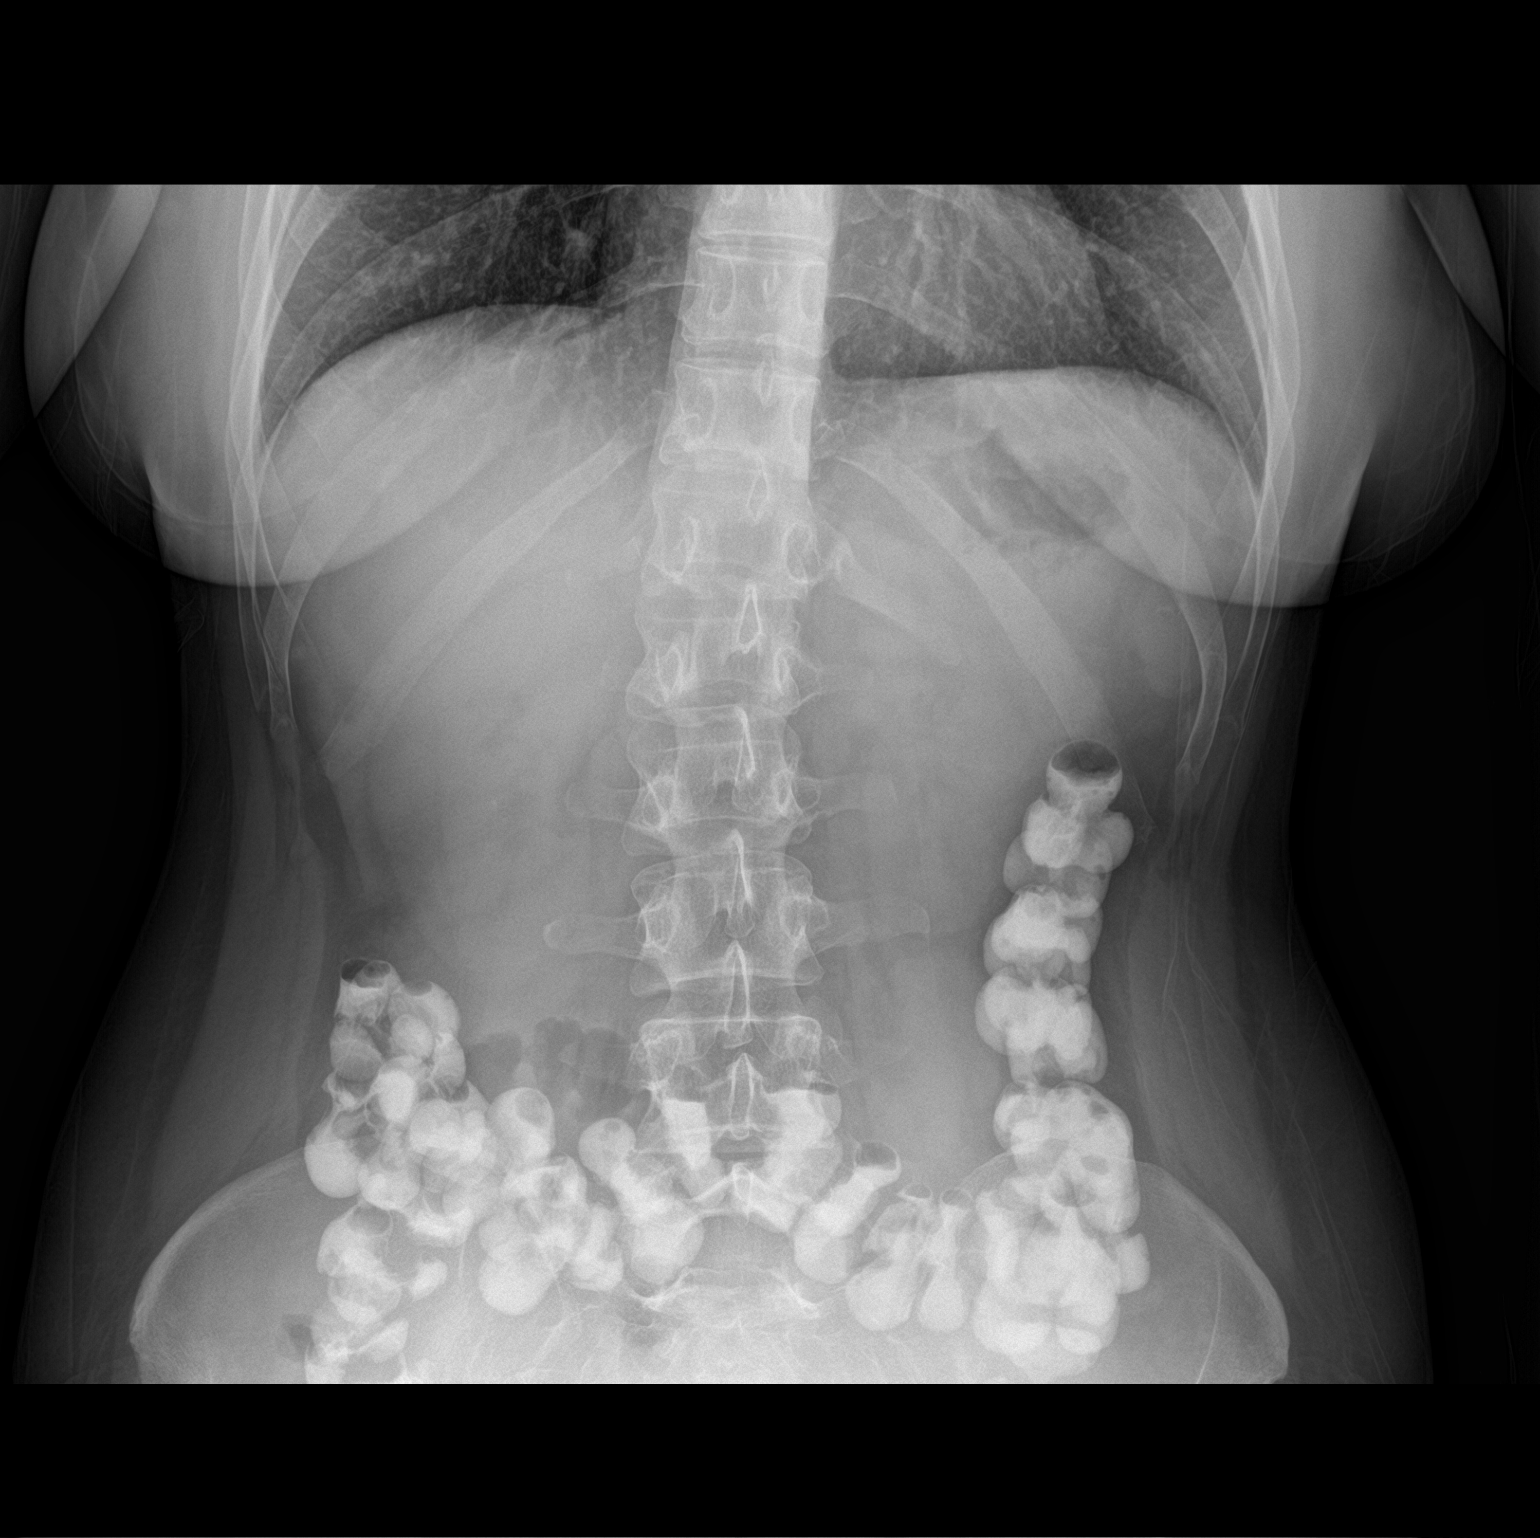

[abdomen supine]
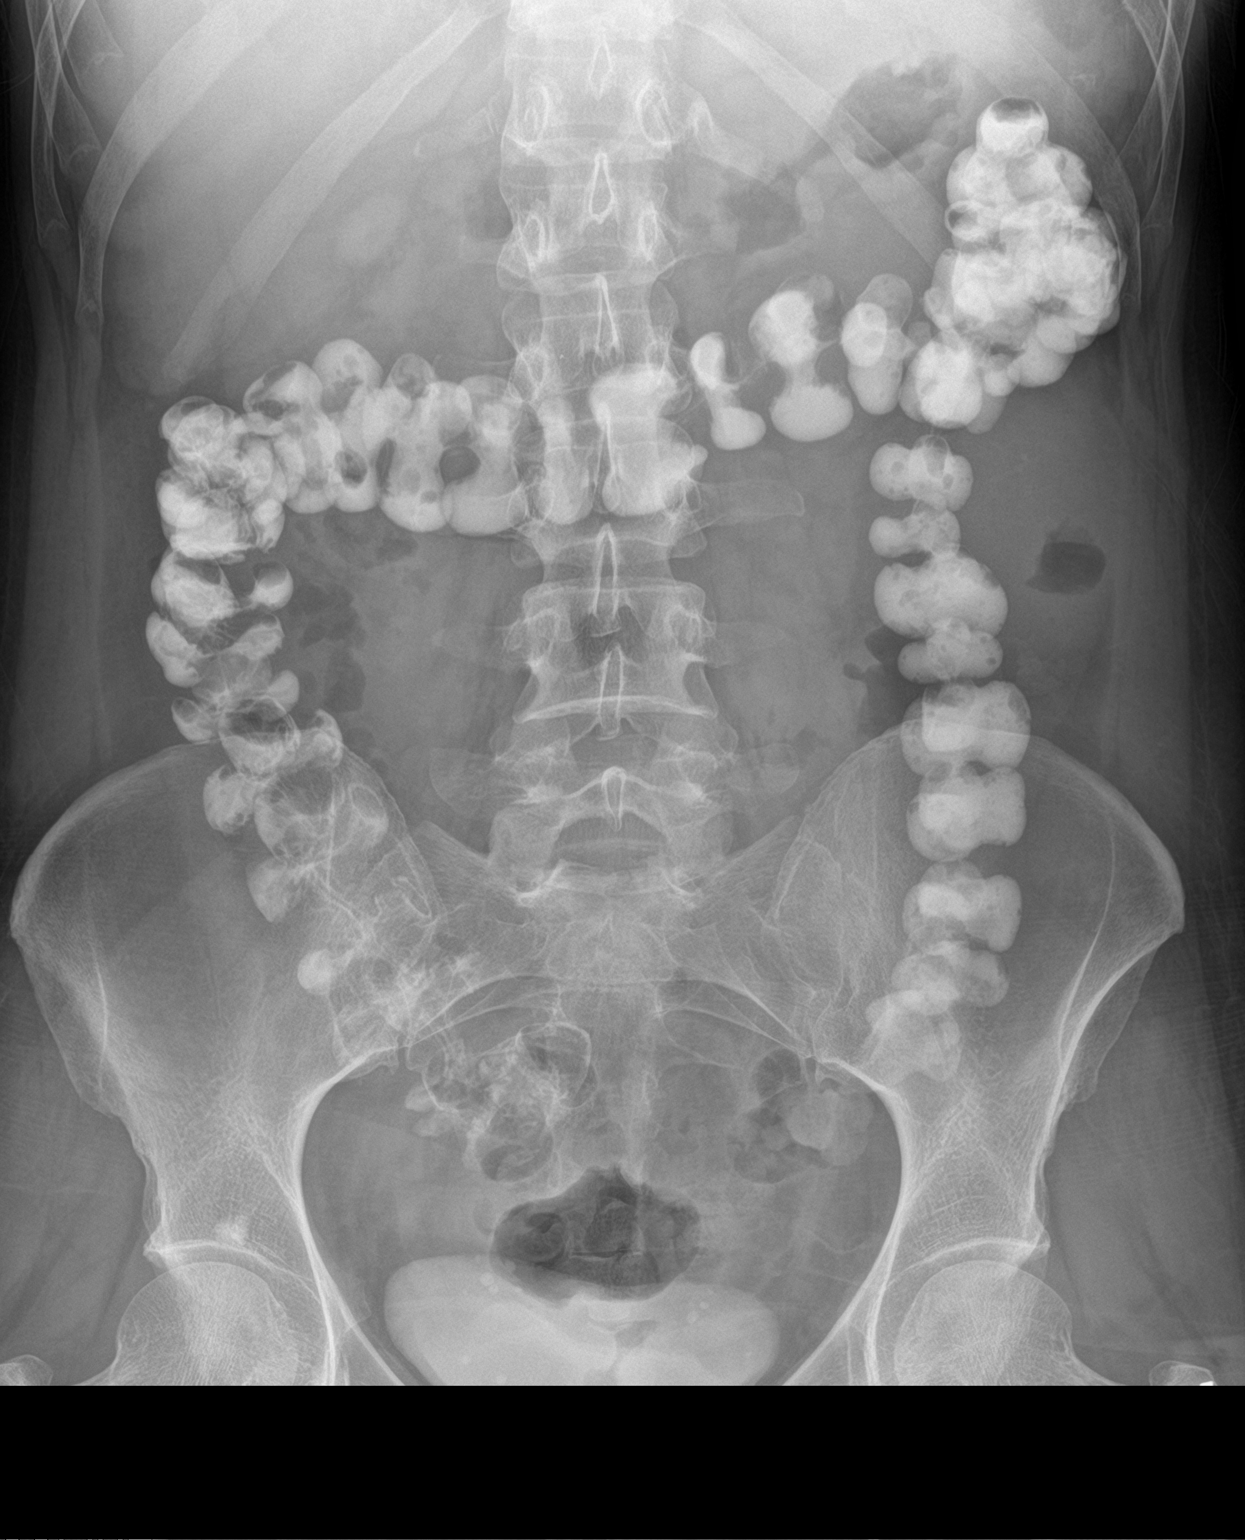

[2 of 2 positions shown; findings below may reference images not displayed]

FINDINGS: Oral contrast from CT 1 day prior is present within the transverse
and descending colon. Gas is stool in the rectum. No pathologic
calcifications. No dilated loops of small bowel evident
IMPRESSION: No radiographic evidence small bowel obstruction.

## 2022-06-25 ENCOUNTER — Other Ambulatory Visit: Payer: Self-pay | Admitting: General Practice

## 2022-06-25 DIAGNOSIS — Z0000 Encounter for general adult medical examination without abnormal findings: Secondary | ICD-10-CM
